# Patient Record
Sex: Male | Born: 2000 | Race: White | Hispanic: No | Marital: Single | Smoking: Never smoker
Health system: Southern US, Community
[De-identification: ages and names within clinical notes are randomized; demographics above are authoritative.]

## PROBLEM LIST (undated history)

## (undated) DIAGNOSIS — E785 Hyperlipidemia, unspecified: Secondary | ICD-10-CM

## (undated) HISTORY — DX: Hyperlipidemia, unspecified: E78.5

---
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
## 2018-04-06 DIAGNOSIS — R05 Cough: Secondary | ICD-10-CM | POA: Diagnosis not present

## 2018-04-06 DIAGNOSIS — R590 Localized enlarged lymph nodes: Secondary | ICD-10-CM | POA: Diagnosis not present

## 2018-04-13 ENCOUNTER — Encounter: Payer: Self-pay | Admitting: Family Medicine

## 2018-04-13 ENCOUNTER — Ambulatory Visit: Payer: BLUE CROSS/BLUE SHIELD | Admitting: Family Medicine

## 2018-04-13 VITALS — BP 112/76 | HR 76 | Temp 98.3°F | Ht 76.0 in | Wt 215.6 lb

## 2018-04-13 DIAGNOSIS — J209 Acute bronchitis, unspecified: Secondary | ICD-10-CM

## 2018-04-13 MED ORDER — BENZONATATE 100 MG PO CAPS: 200.0000 mg | ORAL_CAPSULE | Freq: Three times a day (TID) | ORAL | 0 refills | Status: AC | PRN

## 2018-04-13 MED ORDER — ALBUTEROL SULFATE HFA 108 (90 BASE) MCG/ACT IN AERS: 2.0000 | INHALATION_SPRAY | Freq: Four times a day (QID) | RESPIRATORY_TRACT | 2 refills | Status: AC | PRN

## 2018-04-13 MED ORDER — AMOXICILLIN-POT CLAVULANATE 875-125 MG PO TABS: 1.0000 | ORAL_TABLET | Freq: Two times a day (BID) | ORAL | 0 refills | Status: DC

## 2018-04-13 NOTE — Progress Notes (Signed)
   Subjective:    Patient ID: Eugene Harris, male    DOB: [birth year], 17 y.o.   MRN: [id number]  HPI He complains of a 4-week history that started with nasal congestion, sore throat, rhinorrhea, slight cough but no fever, chills or earache.  He does not smoke.  He did improve but approximately 1 week later the coughing got much worse.  He was also having some right inguinal lymph node swelling.  He was seen in student health.  STD testing was apparently negative.  They treated him symptomatically.  He continues have difficulty with coughing paroxysms but no fever, chills.   Review of Systems     Objective:   Physical Exam Alert and in no distress. Tympanic membranes and canals are normal. Pharyngeal area is normal. Neck is supple without adenopathy or thyromegaly. Cardiac exam shows a regular sinus rhythm without murmurs or gallops. Lungs are clear to auscultation. Abdominal exam shows no abdominal wall tenderness except with contraction of the lower abdominal muscles.  No lymphadenopathy was noted.       Assessment & Plan:  Acute bronchitis, unspecified organism - Plan: albuterol (PROVENTIL HFA;VENTOLIN HFA) 108 (90 Base) MCG/ACT inhaler, amoxicillin-clavulanate (AUGMENTIN) 875-125 MG tablet, benzonatate (TESSALON PERLES) 100 MG capsule I will treat the infection and give him Tessalon Perles as well as Proventil.  He is to take all the antibiotic and call me if he is not totally back to normal.  No major adenopathy and that is lower abdominal discomfort was probably from coughing.

## 2018-07-18 ENCOUNTER — Ambulatory Visit: Payer: BLUE CROSS/BLUE SHIELD | Admitting: Family Medicine

## 2018-07-21 ENCOUNTER — Encounter: Payer: Self-pay | Admitting: Family Medicine

## 2018-07-21 ENCOUNTER — Ambulatory Visit: Payer: BLUE CROSS/BLUE SHIELD | Admitting: Family Medicine

## 2018-07-21 ENCOUNTER — Ambulatory Visit
Admission: RE | Admit: 2018-07-21 | Discharge: 2018-07-21 | Disposition: A | Discharge status: Home / Self Care | Payer: BLUE CROSS/BLUE SHIELD | Source: Ambulatory Visit | Attending: Family Medicine | Admitting: Family Medicine

## 2018-07-21 DIAGNOSIS — R05 Cough: Secondary | ICD-10-CM | POA: Diagnosis not present

## 2018-07-21 DIAGNOSIS — J209 Acute bronchitis, unspecified: Secondary | ICD-10-CM | POA: Diagnosis not present

## 2018-07-21 MED ORDER — AMOXICILLIN-POT CLAVULANATE 875-125 MG PO TABS: 1.0000 | ORAL_TABLET | Freq: Two times a day (BID) | ORAL | 0 refills | Status: AC

## 2018-07-21 NOTE — Progress Notes (Signed)
   Subjective:    Patient ID: Eugene Harris, male    DOB: 22-Jun-2001, 18 y.o.   MRN: [id number]  HPI He was seen in October and treated with Augmentin.  He states that it helped with the cough that went away several days after he finished the antibiotic.  He did use the inhaler.  Since then he has had continued difficulty with intermittent cough that is intermittently productive as well as wheezing mainly on the left-hand side.  No fever, chills, sore throat or earache.  Does not smoke and has no history of allergies.   Review of Systems     Objective:   Physical Exam Alert and in no distress. Tympanic membranes and canals are normal. Pharyngeal area is normal. Neck is supple without adenopathy or thyromegaly. Cardiac exam shows a regular sinus rhythm without murmurs or gallops. Lungs are clear to auscultation.        Assessment & Plan:  Acute bronchitis, unspecified organism - Plan: DG Chest 2 View, amoxicillin-clavulanate (AUGMENTIN) 875-125 MG tablet Since he is having continued difficulty and wheezing mainly on the left, I think an x-ray is certainly appropriate.

## 2018-11-15 DIAGNOSIS — M9904 Segmental and somatic dysfunction of sacral region: Secondary | ICD-10-CM | POA: Diagnosis not present

## 2018-11-15 DIAGNOSIS — M9903 Segmental and somatic dysfunction of lumbar region: Secondary | ICD-10-CM | POA: Diagnosis not present

## 2018-11-15 DIAGNOSIS — M5432 Sciatica, left side: Secondary | ICD-10-CM | POA: Diagnosis not present

## 2018-11-15 DIAGNOSIS — M9902 Segmental and somatic dysfunction of thoracic region: Secondary | ICD-10-CM | POA: Diagnosis not present

## 2018-11-16 DIAGNOSIS — M5432 Sciatica, left side: Secondary | ICD-10-CM | POA: Diagnosis not present

## 2018-11-16 DIAGNOSIS — M9903 Segmental and somatic dysfunction of lumbar region: Secondary | ICD-10-CM | POA: Diagnosis not present

## 2018-11-16 DIAGNOSIS — M461 Sacroiliitis, not elsewhere classified: Secondary | ICD-10-CM | POA: Diagnosis not present

## 2018-11-16 DIAGNOSIS — M9902 Segmental and somatic dysfunction of thoracic region: Secondary | ICD-10-CM | POA: Diagnosis not present

## 2018-11-17 DIAGNOSIS — M461 Sacroiliitis, not elsewhere classified: Secondary | ICD-10-CM | POA: Diagnosis not present

## 2018-11-17 DIAGNOSIS — M9903 Segmental and somatic dysfunction of lumbar region: Secondary | ICD-10-CM | POA: Diagnosis not present

## 2018-11-17 DIAGNOSIS — M9902 Segmental and somatic dysfunction of thoracic region: Secondary | ICD-10-CM | POA: Diagnosis not present

## 2018-11-17 DIAGNOSIS — M5432 Sciatica, left side: Secondary | ICD-10-CM | POA: Diagnosis not present

## 2018-11-24 DIAGNOSIS — M9903 Segmental and somatic dysfunction of lumbar region: Secondary | ICD-10-CM | POA: Diagnosis not present

## 2018-11-24 DIAGNOSIS — M9902 Segmental and somatic dysfunction of thoracic region: Secondary | ICD-10-CM | POA: Diagnosis not present

## 2018-11-24 DIAGNOSIS — M5432 Sciatica, left side: Secondary | ICD-10-CM | POA: Diagnosis not present

## 2018-11-24 DIAGNOSIS — M461 Sacroiliitis, not elsewhere classified: Secondary | ICD-10-CM | POA: Diagnosis not present

## 2018-12-01 DIAGNOSIS — M9902 Segmental and somatic dysfunction of thoracic region: Secondary | ICD-10-CM | POA: Diagnosis not present

## 2018-12-01 DIAGNOSIS — M5432 Sciatica, left side: Secondary | ICD-10-CM | POA: Diagnosis not present

## 2018-12-01 DIAGNOSIS — M461 Sacroiliitis, not elsewhere classified: Secondary | ICD-10-CM | POA: Diagnosis not present

## 2018-12-01 DIAGNOSIS — M9903 Segmental and somatic dysfunction of lumbar region: Secondary | ICD-10-CM | POA: Diagnosis not present

## 2018-12-21 DIAGNOSIS — M9902 Segmental and somatic dysfunction of thoracic region: Secondary | ICD-10-CM | POA: Diagnosis not present

## 2018-12-21 DIAGNOSIS — M461 Sacroiliitis, not elsewhere classified: Secondary | ICD-10-CM | POA: Diagnosis not present

## 2018-12-21 DIAGNOSIS — M9903 Segmental and somatic dysfunction of lumbar region: Secondary | ICD-10-CM | POA: Diagnosis not present

## 2019-06-06 IMAGING — CR DG CHEST 2V
2 series · 2 of 2 positions shown · non-contrast
Comparison: None.

CLINICAL DATA: Cough, wheezing, bronchitis

EXAM:
CHEST - 2 VIEW

[w chest pa]
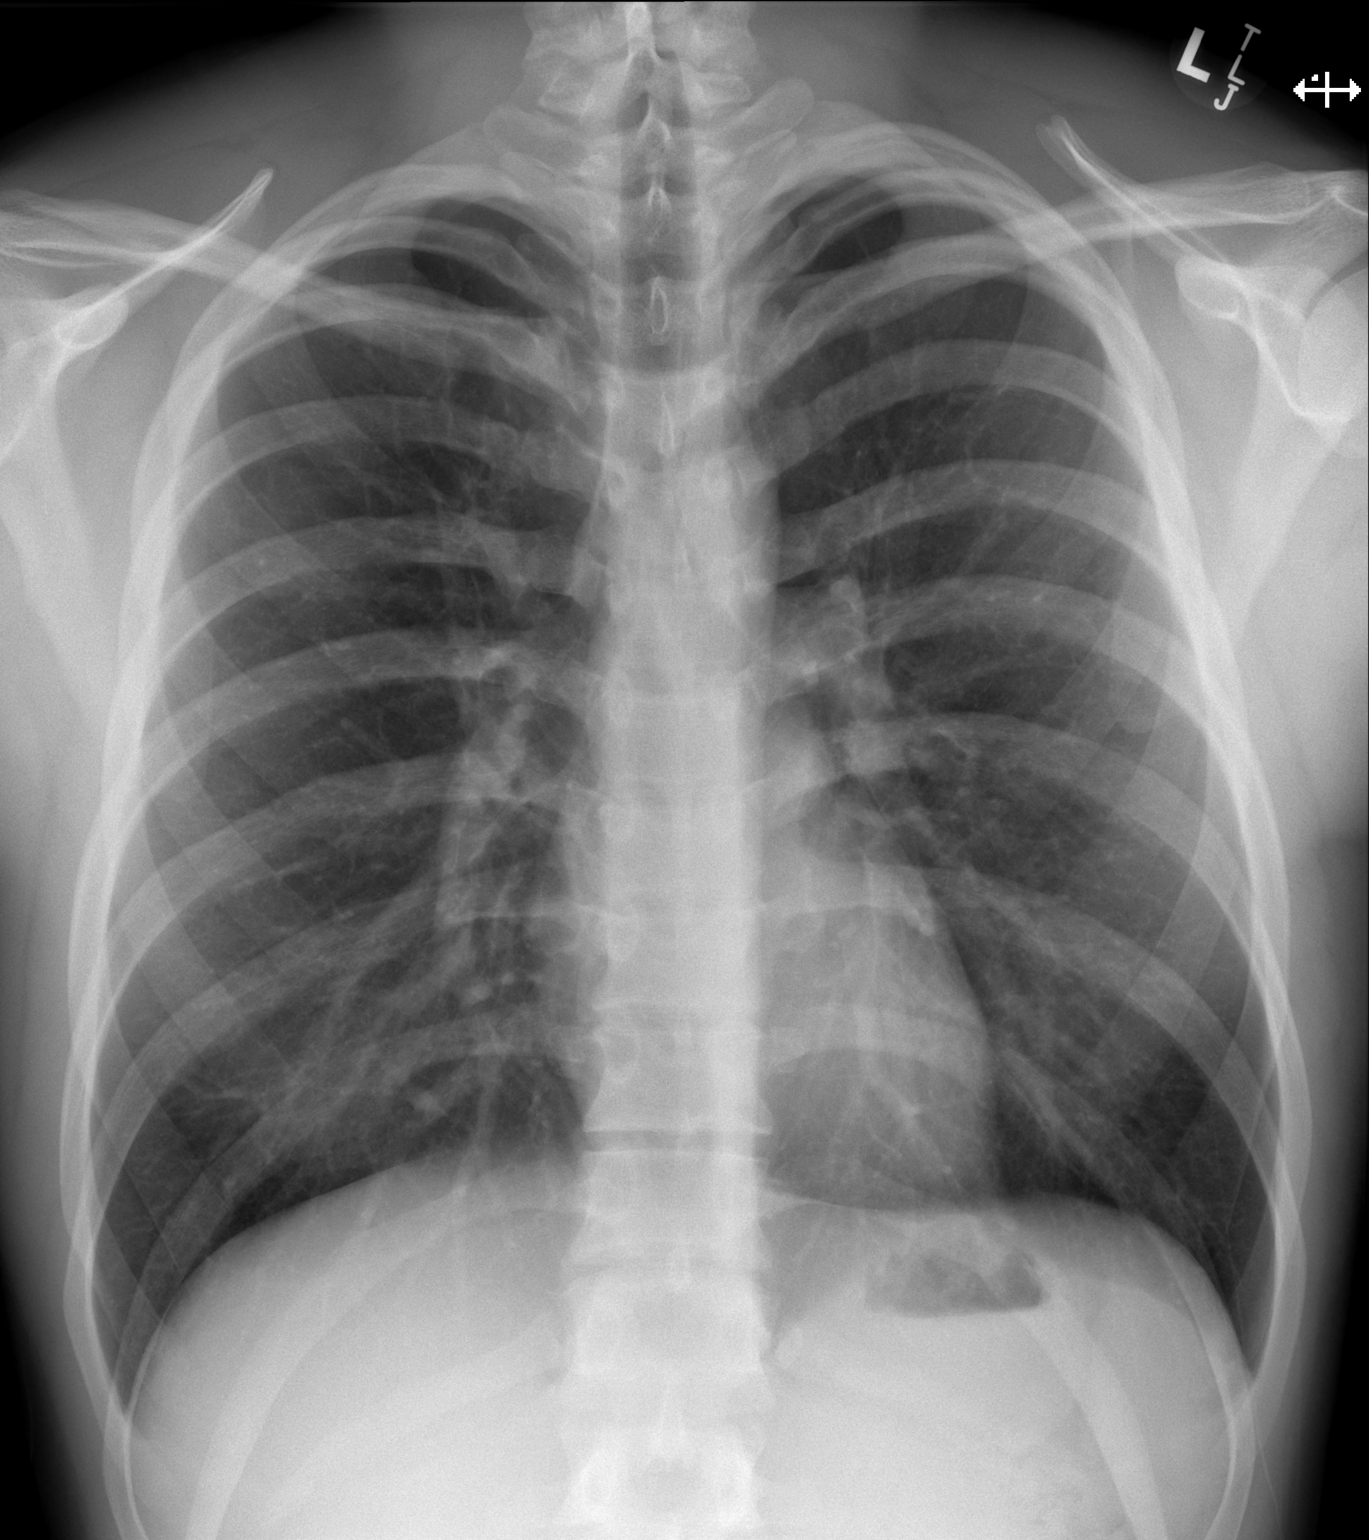

[w chest lat]
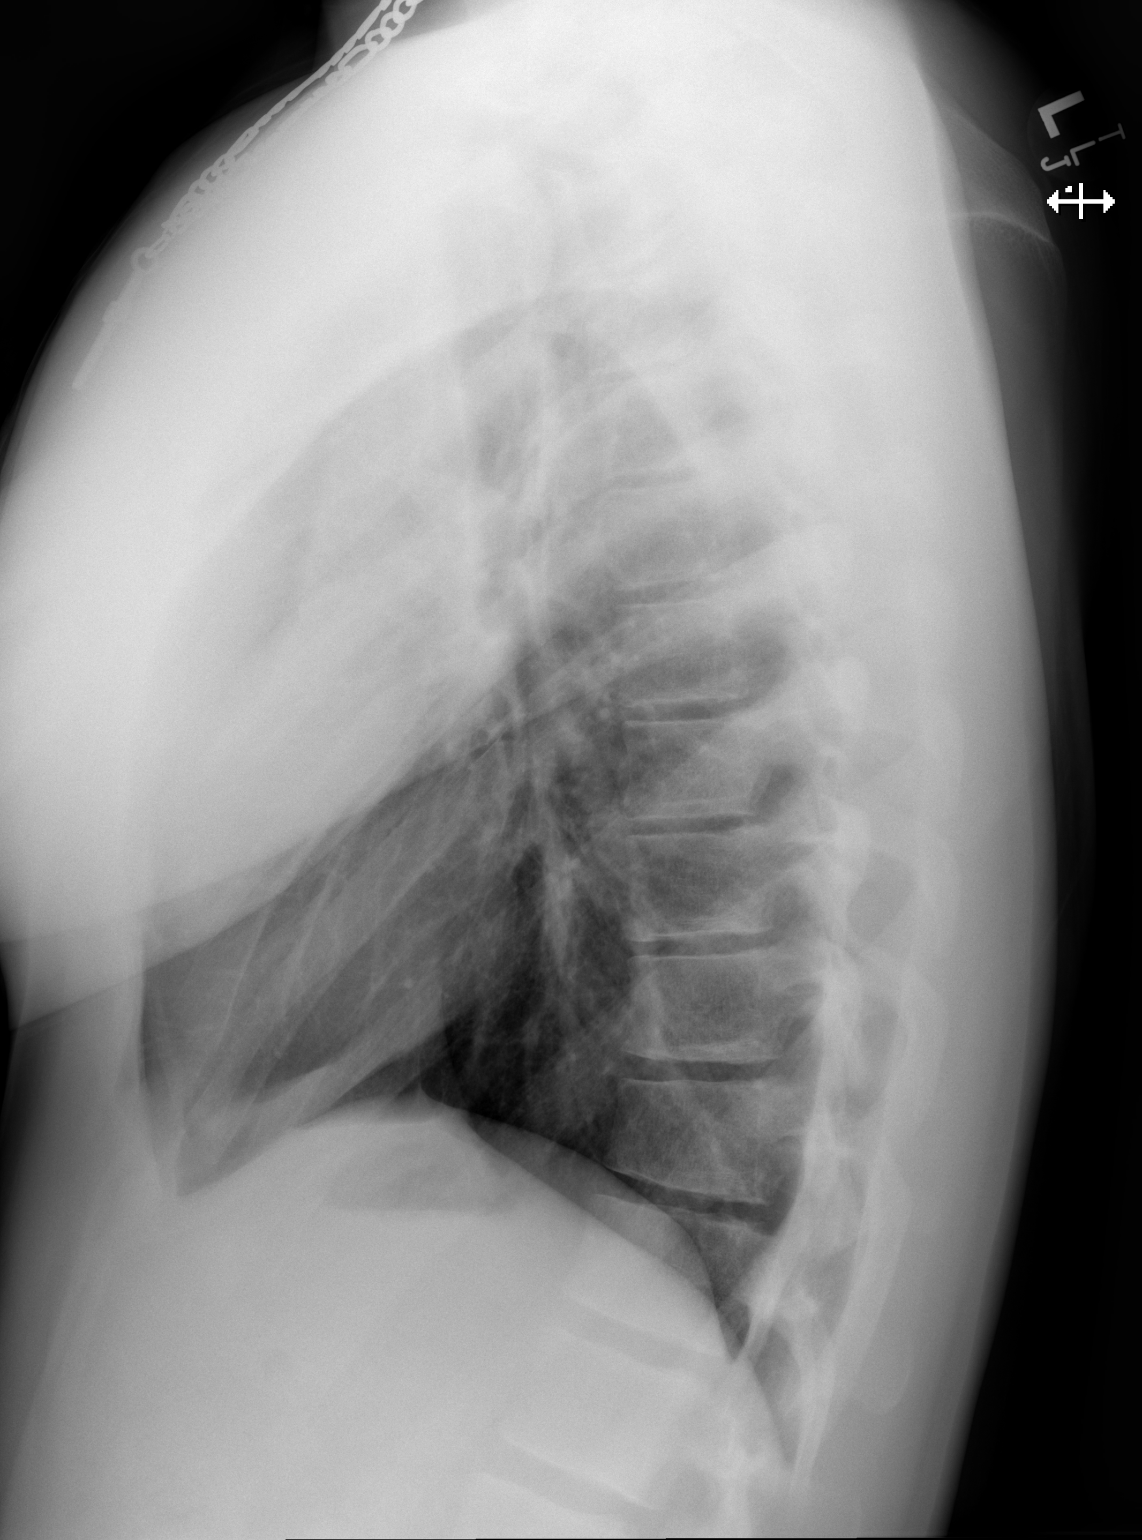

[2 of 2 positions shown; findings below may reference images not displayed]

FINDINGS: The heart size and mediastinal contours are within normal limits.
Both lungs are clear. The visualized skeletal structures are
unremarkable.
IMPRESSION: No active cardiopulmonary disease.

## 2021-08-25 NOTE — Progress Notes (Unsigned)
[address]  [place], [place] [address]  Ph. [phone], Valinda Hoar [phone]                Date of Exam: 08/26/2021 12:59 PM        Patient ID: Kevin Flowers is a 21 y.o. male.  Attending Physician: Laurence Spates, DNP FNP        Chief Complaint:    No chief complaint on file.              HPI:    Pt is 21 yo male presenting for well exam. Patient is fasting today***    Chronic diseases***  Refills needed today: ***  Pharmacy: ***    Depression screen:  PHQ-2/9 score: ***  No concerns for SI or HI: ***    Vaccines:  - TDAP: ***  - Flu: ***  - COVID: 4/4***    Screenings:  Last eye exam: ***  Last dental exam: every 6 months***   Derm: ***    Exercise: ***  Diet:  ***  H2O: adequate***  Caffiene: ***   Smoking:  ***  Alcohol: ***  Sleep: 7-8 hrs***  Sexually active: ***  Any concerns for STI: ***  Problems with erections: ***  Self testicular exams: ***  Advanced Directives: ***    New problems:    Patient verbally understands that any new acute health problems, uncontrolled chronic medical conditions, or office procedures that is done today are not considered part of the physical and that an additional copayment or deductible may apply***          Problem List:    There is no problem list on file for this patient.            Current Meds:    No current outpatient medications on file.        Allergies:    Not on File          Past Surgical History:    No past surgical history on file.        Family History:    No family history on file.        Social History:             The following sections were reviewed this encounter by the provider:            Vital Signs:    There were no vitals taken for this visit.         ROS:    Review of Systems   Constitutional:  Negative for chills, fatigue, fever and unexpected weight change.   HENT:  Negative for ear pain and hearing loss.    Eyes:  Negative for pain and visual disturbance.   Respiratory:  Negative for shortness of breath.     Cardiovascular:  Negative for chest pain, palpitations and leg swelling.   Gastrointestinal:  Negative for abdominal pain, blood in stool, constipation, diarrhea, nausea and vomiting.   Endocrine: Negative for cold intolerance and heat intolerance.   Genitourinary:  Negative for difficulty urinating.   Musculoskeletal:  Negative for arthralgias.   Skin:  Negative for rash.   Neurological:  Negative for tremors, weakness, numbness and headaches.   Hematological:  Does not bruise/bleed easily.   Psychiatric/Behavioral:  Negative for dysphoric mood.             Physical Exam:    Physical Exam  Vitals reviewed.   Constitutional:       General: He  is not in acute distress.     Appearance: Normal appearance. He is well-developed and well-groomed. He is not ill-appearing or toxic-appearing.   HENT:      Head: Normocephalic and atraumatic.      Right Ear: Tympanic membrane, ear canal and external ear normal.      Left Ear: Tympanic membrane, ear canal and external ear normal.      Nose: Nose normal.      Mouth/Throat:      Mouth: Mucous membranes are moist.      Pharynx: Oropharynx is clear. Uvula midline. No posterior oropharyngeal erythema.   Eyes:      General: Lids are normal. No scleral icterus.     Extraocular Movements: Extraocular movements intact.      Pupils: Pupils are equal, round, and reactive to light.   Neck:      Thyroid: No thyroid mass, thyromegaly or thyroid tenderness.      Vascular: No carotid bruit.   Cardiovascular:      Rate and Rhythm: Normal rate and regular rhythm.      Pulses:           Radial pulses are 2+ on the right side and 2+ on the left side.        Dorsalis pedis pulses are 2+ on the right side and 2+ on the left side.      Heart sounds: Normal heart sounds.   Pulmonary:      Effort: Pulmonary effort is normal. No respiratory distress.      Breath sounds: Normal breath sounds and air entry. No stridor or decreased air movement. No decreased breath sounds, wheezing, rhonchi or rales.    Abdominal:      General: Abdomen is flat. Bowel sounds are normal. There is no distension.      Palpations: Abdomen is soft. There is no hepatomegaly, splenomegaly or mass.      Tenderness: There is no abdominal tenderness.   Genitourinary:     Comments: Discussion today with pt  No issues or concerns  Pt and I have decided to defer exam  Will followup if any issues at anytime    Musculoskeletal:         General: No swelling or tenderness.      Cervical back: Neck supple.      Right lower leg: No edema.      Left lower leg: No edema.   Lymphadenopathy:      Cervical: No cervical adenopathy.   Skin:     General: Skin is warm and dry.      Coloration: Skin is not cyanotic or jaundiced.      Findings: No lesion.      Nails: There is no clubbing.   Neurological:      General: No focal deficit present.      Mental Status: He is alert. Mental status is at baseline.      Cranial Nerves: No cranial nerve deficit or facial asymmetry.      Sensory: No sensory deficit.      Motor: No weakness.      Gait: Gait normal.   Psychiatric:         Attention and Perception: Attention normal.         Mood and Affect: Affect normal.         Behavior: Behavior normal. Behavior is cooperative.              Assessment:    1. Well  adult exam    2. Screening for lipid disorders            Plan:    General Wellness:  Check screening blood work - informed pt results will be available via MyChart  Encouraged regular aerobic exercise at least 3-4 times per week for 30-45 minutes per session  Encouraged healthy diet with 5-9 servings of fruits and vegetables per day  Encouraged dental exam every 6-12 months  Encouraged eye exam every 1-2 years  Encouraged adequate daily H2O intake  Education provided regarding sun exposure and skin care habits  Continue specialist visits for your chronic diseases***  Please sign up to My Chart. This system allows you to review your medical records, lab results and it also allows you to e-mail me directly and or  make appointments with me on line.***    Vaccines:   *** administered today - VIS provided to pt   Advised patient to get the COVID booster and flu vaccine*** at a local pharmacy***  Pt educated regarding common side effects and warning signs of the vaccines administered today    All questions answered. Patient agreeable and amenable to plan. Patient verbalizes understanding.          Follow-up:    No follow-ups on file.         Laurence Spates, DNP FNP

## 2021-08-26 ENCOUNTER — Ambulatory Visit (INDEPENDENT_AMBULATORY_CARE_PROVIDER_SITE_OTHER): Payer: No Typology Code available for payment source | Admitting: Registered Nurse

## 2021-08-26 ENCOUNTER — Encounter (INDEPENDENT_AMBULATORY_CARE_PROVIDER_SITE_OTHER): Payer: Self-pay | Admitting: Registered Nurse

## 2021-08-26 VITALS — BP 110/70 | HR 72 | Temp 97.9°F | Resp 16 | Ht 75.75 in | Wt 221.6 lb

## 2021-08-26 DIAGNOSIS — M25561 Pain in right knee: Secondary | ICD-10-CM

## 2021-08-26 DIAGNOSIS — Z8279 Family history of other congenital malformations, deformations and chromosomal abnormalities: Secondary | ICD-10-CM

## 2021-08-26 DIAGNOSIS — Z0001 Encounter for general adult medical examination with abnormal findings: Secondary | ICD-10-CM

## 2021-08-26 DIAGNOSIS — G8929 Other chronic pain: Secondary | ICD-10-CM

## 2021-08-26 DIAGNOSIS — Z0000 Encounter for general adult medical examination without abnormal findings: Secondary | ICD-10-CM

## 2021-08-26 DIAGNOSIS — Z13220 Encounter for screening for lipoid disorders: Secondary | ICD-10-CM

## 2021-08-26 LAB — COMPREHENSIVE METABOLIC PANEL
ALT: 18 U/L (ref 0–55)
AST (SGOT): 18 U/L (ref 5–41)
Albumin/Globulin Ratio: 1.7 (ref 0.9–2.2)
Albumin: 4.4 g/dL (ref 3.5–5.0)
Alkaline Phosphatase: 69 U/L (ref 37–117)
Anion Gap: 9 (ref 5.0–15.0)
BUN: 14 mg/dL (ref 9.0–28.0)
Bilirubin, Total: 0.6 mg/dL (ref 0.2–1.2)
CO2: 27 mEq/L (ref 17–29)
Calcium: 9.7 mg/dL (ref 8.5–10.5)
Chloride: 104 mEq/L (ref 99–111)
Creatinine: 1 mg/dL (ref 0.5–1.5)
Globulin: 2.6 g/dL (ref 2.0–3.6)
Glucose: 87 mg/dL (ref 70–100)
Potassium: 4.3 mEq/L (ref 3.5–5.3)
Protein, Total: 7 g/dL (ref 6.0–8.3)
Sodium: 140 mEq/L (ref 135–145)

## 2021-08-26 LAB — GFR: EGFR: >60

## 2021-08-26 LAB — LIPID PANEL
Cholesterol / HDL Ratio: 3.5 Index
Cholesterol: 203 mg/dL — ABNORMAL HIGH (ref 0–199)
HDL: 58 mg/dL (ref 40–9999)
LDL Calculated: 134 mg/dL — ABNORMAL HIGH (ref 0–99)
Triglycerides: 57 mg/dL (ref 34–149)
VLDL Calculated: 11 mg/dL (ref 10–40)

## 2021-08-26 LAB — HEMOLYSIS INDEX: Hemolysis Index: 6 Index (ref 0–24)

## 2021-08-26 NOTE — Patient Instructions (Addendum)
Plan:    General Wellness:  Check screening blood work - informed pt results will be available via MyChart  Encouraged regular aerobic exercise at least 3-4 times per week for 30-45 minutes per session  Encouraged healthy diet with 5-9 servings of fruits and vegetables per day  Encouraged dental exam every 6-12 months  Encouraged eye exam every 1-2 years  Encouraged adequate daily H2O intake  Education provided regarding sun exposure and skin care habits    Chronic right knee pain  Advised patient to follow-up with the orthopedist-referral placed and recommendations provided    Family history of bicuspid aortic valve:  Follow-up with the cardiologist-referral placed and recommendations provided    Vaccines:   Patient will check his Tdap and HPV vaccine records and upload. advised patient to schedule nurse visit appointment if he has not gotten the tetanus vaccine in the last 10 years and if he has not had the HPV vaccine  Advised patient to get the COVID booster and flu vaccine at a local pharmacy          Follow-up:    Return in about 1 year (around 08/27/2022) for  annual exam or sooner as needed.    Cardiologist in the area    [facility]  [address]  [place], [place] [address]  Tel: [phone]    [facility]   [phone]  [email or web address]    [facility]   [phone]  [email or web address]      Orthopedic doctors in the area    [facility] offices  [address]  [address]  [place], [place] [address]  [phone]  [email or web address]    [facility]  [address]  [place], [place] [address]  [phone]  [email or web address]    [facility]  [address] 8th floor  [place], [place] [address]  [phone]  [email or web address]    [facility]  [address] [place], [place] [address]  [email or web address]  [phone]    [employer] [facility])  [address]  [place], [place] [address]   "[facility]"  [email or web address]  [phone]    [facility]  [address]  [place], [place] [address]    [facility]  [phone]  [place], [place] [address]  [phone]  [email or web address]    [facility]  [email or web address]  - Offices in [place] and [employer]     OR     [facility]  [email or web address]  - multiple offices    OR    [facility], [facility], [facility]  [email or web address]  - [phone]

## 2021-09-23 ENCOUNTER — Ambulatory Visit (INDEPENDENT_AMBULATORY_CARE_PROVIDER_SITE_OTHER): Payer: No Typology Code available for payment source | Admitting: Cardiology

## 2021-10-01 ENCOUNTER — Ambulatory Visit (INDEPENDENT_AMBULATORY_CARE_PROVIDER_SITE_OTHER): Payer: No Typology Code available for payment source | Admitting: Cardiology

## 2021-10-01 ENCOUNTER — Encounter (INDEPENDENT_AMBULATORY_CARE_PROVIDER_SITE_OTHER): Payer: Self-pay | Admitting: Cardiology

## 2021-10-01 VITALS — BP 112/70 | HR 55 | Ht 76.0 in | Wt 217.0 lb

## 2021-10-01 DIAGNOSIS — Z8279 Family history of other congenital malformations, deformations and chromosomal abnormalities: Secondary | ICD-10-CM

## 2021-10-01 DIAGNOSIS — R079 Chest pain, unspecified: Secondary | ICD-10-CM

## 2021-10-01 NOTE — Progress Notes (Signed)
[facility] OFFICE CONSULTATION NOTE    HRT [facility] OFFICE -CARDIOLOGY  [address]  [place] [address]  Dept: [phone]  Dept Fax: [phone]         Patient Name: Kevin Flowers, Kevin Flowers    Date of Visit:  October 01, 2021  Date of Birth: 2000-12-11  AGE: 21 y.o.  Medical Record #: [id number]  Requesting Physician: Laurence Spates, DNP FNP      CHIEF COMPLAINT:  Establish Care      HISTORY OF PRESENT ILLNESS    Kevin Flowers is being seen today for cardiovascular evaluation at the request of Laurence Spates, DNP FNP. He is a pleasant 21 y.o. male who presents for chest discomfort and family history of bicuspid aortic valve.    Patient reports he has been feeling symptomatically well.  His uncle was diagnosed with a bicuspid aortic valve, and recently required surgery.  He is unaware of other family members with bicuspid valve.  His mother has been tested and the valve was normal.  He is uncertain about his father.    Patient also notes that since his teenage years, he has had some left-sided chest discomfort.  He described a stabbing sensation in the left chest, typically worsened with changes in position and taking a deep breath.  He exercises vigorously, primarily weightlifting.  There is no exertional component to his chest discomfort.    With respect ASCVD risk factors, patient has normal blood pressure.  Lipids are mildly elevated.  He does not have diabetes.  He does not smoke cigarettes.      PAST MEDICAL HISTORY: He has a past medical history of Hyperlipidemia (5 years ago). He has no past surgical history on file.    ALLERGIES: No Known Allergies    MEDICATIONS:   Patient's current medications were reviewed. ONLY Cardiac medications were updated unless others were addressed in assessment and plan.  No current outpatient medications on file.     FAMILY HISTORY: family history is not on file.    SOCIAL HISTORY: He reports that he has never  smoked. He quit smokeless tobacco use about 10 months ago.  His smokeless tobacco use included chew. He reports current alcohol use of about 6.0 standard drinks of alcohol per week. He reports that he does not use drugs.    REVIEW OF SYSTEMS:   No data recorded       PHYSICAL EXAMINATION    Visit Vitals  BP 112/70 (BP Site: Left arm, Patient Position: Sitting, Cuff Size: Medium)   Pulse (!) 55   Ht 1.93 m (6\' 4" )   Wt 98.4 kg (217 lb)   BMI 26.41 kg/m        General Appearance:  A well-appearing male in no acute distress.    Skin: Warm and dry to touch, no apparent skin lesions, or masses noted.  Head: Normocephalic, normal hair pattern, no masses or tenderness   Eyes: EOMS Intact, conjunctivae and lids unremarkable.  ENT: Ears, Nose and throat reveal no gross abnormalities.  No pallor or cyanosis.  Dentition good.   Neck: JVP normal, no carotid bruit  Chest: Clear to auscultation bilaterally with good air movement and respiratory effort and no wheezes, rales, or rhonchi   Cardiovascular: Regular rhythm, S1 normal, S2 normal, No S3 or S4.  No murmur. No gallops or rubs detected   Abdomen: Soft, nontender, nondistended.  Extremities: Warm without edema. No clubbing, or cyanosis. All  peripheral pulses are full and equal.   Neuro: Alert and oriented x3. No gross motor or sensory deficits noted, affect appropriate.        ECG: Sinus bradycardia.  No ST segment or T wave abnormalities.      LABS:   No results found for: WBC, HGB, HCT, PLT  Lab Results   Component Value Date    GLU 87 08/26/2021    BUN 14.0 08/26/2021    CREAT 1.0 08/26/2021    NA 140 08/26/2021    K 4.3 08/26/2021    CL 104 08/26/2021    CO2 27 08/26/2021    AST 18 08/26/2021    ALT 18 08/26/2021     No results found for: MG, TSH, HGBA1C, BNP  Lab Results   Component Value Date    CHOL 203 (H) 08/26/2021    TRIG 57 08/26/2021    HDL 58 08/26/2021    LDL 134 (H) 08/26/2021           IMPRESSION:   Kevin Flowers is a 21 y.o. male with the following  problems:    Atypical chest discomfort.  Stabbing character, not provoked by exertion nor relieved by rest, in young patient with minimal ASCVD risk factors (mild HLD).  Hyperlipidemia.  LDL 134 mg/dL March 16102023.  Family history bicuspid AV in uncle.      RECOMMENDATIONS:    Reassured that suspicion is low for cardiac etiology of his chronic chest discomfort.  We will pursue treadmill stress test for reassurance.  Discussed maintenance of healthy weight, healthy diet choices and exercise as behavioral management of mild hyperlipidemia.  Echocardiogram to assess aortic valve given family history.                                                 Orders Placed This Encounter   Procedures    ECG 12 lead (Normal)    Exercise Stress Test (Office Treadmill)    Echocardiogram Adult Complete W Clr/ Dopp Waveform       No orders of the defined types were placed in this encounter.        SIGNED:    Verner MouldScott M Margretta Zamorano, MD         This note was generated by the Dragon speech recognition and may contain errors or omissions not intended by the user. Grammatical errors, random word insertions, deletions, pronoun errors, and incomplete sentences are occasional consequences of this technology due to software limitations. Not all errors are caught or corrected. If there are questions or concerns about the content of this note or information contained within the body of this dictation, they should be addressed directly with the author for clarification.

## 2021-10-06 ENCOUNTER — Encounter (INDEPENDENT_AMBULATORY_CARE_PROVIDER_SITE_OTHER): Payer: Self-pay

## 2021-11-11 ENCOUNTER — Other Ambulatory Visit (INDEPENDENT_AMBULATORY_CARE_PROVIDER_SITE_OTHER): Payer: No Typology Code available for payment source

## 2021-11-25 ENCOUNTER — Ambulatory Visit (INDEPENDENT_AMBULATORY_CARE_PROVIDER_SITE_OTHER): Payer: No Typology Code available for payment source | Admitting: Internal Medicine

## 2021-11-25 ENCOUNTER — Encounter (INDEPENDENT_AMBULATORY_CARE_PROVIDER_SITE_OTHER): Payer: Self-pay

## 2021-11-25 VITALS — BP 122/76 | HR 56 | Temp 97.5°F | Resp 16 | Ht 76.0 in | Wt 220.0 lb

## 2021-11-25 DIAGNOSIS — B349 Viral infection, unspecified: Secondary | ICD-10-CM

## 2021-11-25 DIAGNOSIS — J029 Acute pharyngitis, unspecified: Secondary | ICD-10-CM

## 2021-11-25 LAB — POCT RAPID STREP A: Rapid Strep A Screen POCT: NEGATIVE

## 2021-11-25 LAB — POCT INFECTIOUS MONONUCLEOSIS ANTIBODY: POCT Infectious Mono Heterophile Antibodies: NEGATIVE

## 2021-11-25 LAB — IN OFFICE COVID POCT ABBOTT ID NOW: SARS CoV 2 Overall Result: NEGATIVE

## 2021-11-25 NOTE — Progress Notes (Signed)
[facility]  URGENT  CARE  PROGRESS NOTE     Patient: Kevin Flowers   Date: 11/25/2021   MRN: [id number]       Kevin Flowers is a 21 y.o. male      HISTORY     History obtained from: Patient    Chief Complaint   Patient presents with    Sore Throat     Sore throat, chills and fatigue since Monday        HPI 21 yo M with no significant PMH.  C/O acute onset on 5-29 of pharyngitis, chills and very fatigued.  No N/V/D, SOB, CP or abdominal pain.  COVID vaxxed.  No meds taken.    Review of Systems   All other systems reviewed and are negative.  As above.    History:    Pertinent Past Medical, Surgical, Family and Social History were reviewed.      No current outpatient medications on file.    No Known Allergies    Medications and Allergies reviewed.    PHYSICAL EXAM     Vitals:    11/25/21 1047   BP: 122/76   Pulse: (!) 56   Resp: 16   Temp: 97.5 F (36.4 C)   TempSrc: Oral   SpO2: 98%   Weight: 99.8 kg (220 lb)   Height: 1.93 m (6\' 4" )       Physical Exam  HENT:      Mouth/Throat:      Mouth: Mucous membranes are moist.      Pharynx: Oropharynx is clear. Posterior oropharyngeal erythema present. No oropharyngeal exudate.       Vitals and nursing note reviewed.   Constitutional:       General: Not in acute distress.     Appearance: Normal appearance. Not ill-appearing or toxic-appearing.   HENT:      Head: Normocephalic and atraumatic.   Eyes:      Conjunctiva/sclera: Conjunctivae normal.   Neck:      Musculoskeletal: Normal range of motion.   Respiratory:      Normal effort. Able to speak in full sentences.  Neurological:      Mental Status: Alert and oriented.  Psychiatric:         Mood and Affect: Mood normal.         Behavior: Behavior normal.     UCC COURSE     LABS  The following POCT tests were ordered, reviewed and discussed with the patient/family.     Results       Procedure Component Value Units Date/Time    Mono Screen [[id number]]  (Normal) Collected: 11/25/21 1127     Updated: 11/25/21 1127     POCT QC Pass      POCT Infectious Mono Heterophile Antibodies Negative    [facility] ID Now SARS-COV-2 POCT [[id number]]  (Normal) Collected: 11/25/21 1106    Specimen: Nasal Swab COVID-19 Updated: 11/25/21 1106     SARS CoV 2 Overall Result Negative    POCT Rapid Group A Strep [[id number]]  (Normal) Collected: 11/25/21 1100    Specimen: Throat Updated: 11/25/21 1101     POCT QC Pass     Rapid Strep A Screen POCT Negative     Comment Negative Results should be confirmed by throat Cx to confirm absence of Strep A inf.            There were no x-rays reviewed with this patient during the visit.    No current facility-administered medications  for this visit.       PROCEDURES     Procedures    MEDICAL DECISION MAKING     History, physical, labs/studies most consistent with acute viral syndrome as the diagnosis.    Chart Review:  Prior PCP, Specialist and/or ED notes reviewed today: No  Prior labs/images/studies reviewed today: No    Differential Diagnosis: COVID 19; Flu; Acute viral syndrome; Strep; acute bronchitis - viral, bacterial, allergic       ASSESSMENT     Encounter Diagnoses   Name Primary?    Sore throat Yes    Acute viral syndrome             PLAN      PLAN: Tylenol / ibuprofen; Push fluids; Nasal saline;  F/U with PCP in 2-3 days.    Declined Throat culture.          Orders Placed This Encounter   Procedures    [facility]ott ID Now SARS-COV-2 POCT    POCT Rapid Group A Strep    Mono Screen     Requested Prescriptions      No prescriptions requested or ordered in this encounter       Discussed results and diagnosis with patient/family.  Reviewed warning signs for worsening condition, as well as, indications for follow-up with primary care physician and return to urgent care clinic.   Patient/family expressed understanding of instructions.     An After Visit Summary was provided to the patient.

## 2021-11-25 NOTE — Patient Instructions (Addendum)
Tylenol and or ibuprofen as directed on the bottles.  Nasal saline several times a day.  Nasal steroid.  Mucinex DM.  Push fluids with electrolytes.  To the Emergency Room or call 911 for any chest pain, difficulty breathing, heart racing, any concern.  Follow up with your doctor in 2-3 days.

## 2021-12-11 ENCOUNTER — Ambulatory Visit (INDEPENDENT_AMBULATORY_CARE_PROVIDER_SITE_OTHER): Payer: No Typology Code available for payment source | Admitting: Family

## 2021-12-11 ENCOUNTER — Encounter (INDEPENDENT_AMBULATORY_CARE_PROVIDER_SITE_OTHER): Payer: Self-pay

## 2021-12-11 VITALS — BP 150/82 | HR 80 | Temp 97.3°F | Resp 18 | Ht 76.0 in | Wt 214.0 lb

## 2021-12-11 DIAGNOSIS — N50812 Left testicular pain: Secondary | ICD-10-CM

## 2021-12-11 LAB — MCKESSON POCT UA 120
Bilirubin UA: NEGATIVE
Blood UA: NEGATIVE
Glucose UA: NEGATIVE
Ketone UA: NEGATIVE
Leukocytes UA: NEGATIVE
Nitrite UA: NEGATIVE
Protein UA: NEGATIVE
Specific Gravity UA: 1.015
Urobilinogen UA: NEGATIVE
pH UA: 6

## 2021-12-11 MED ORDER — DOXYCYCLINE HYCLATE 100 MG PO TABS
100.0000 mg | ORAL_TABLET | Freq: Two times a day (BID) | ORAL | 0 refills | Status: AC
Start: 2021-12-11 — End: 2021-12-21

## 2021-12-11 NOTE — Progress Notes (Signed)
[facility]  [facility]  PROGRESS NOTE     Patient: Kevin Flowers   Date: 12/11/2021   MRN: [id number]       Kevin Flowers is a 21 y.o. male present with complain of left groin/testicular pain for the past 1.5 months.  States symptoms started after masturbating and pain is always after masturbating as well.  Denies dysuria, or penile discharge or fever.  Denies recent trauma or strenuous activities       HISTORY     History obtained from: Patient    Chief Complaint   Patient presents with    Pain     Sx x 1-2 months  Groin  denies any recent injury or trauma          History provided by:  Patient  Language interpreter used: No         Review of Systems   Constitutional: Negative.    HENT: Negative.     Respiratory: Negative.     Cardiovascular: Negative.    Genitourinary:  Positive for testicular pain. Negative for difficulty urinating, dysuria, flank pain, frequency, genital sores, penile discharge, penile pain, penile swelling and scrotal swelling.   Musculoskeletal: Negative.    Neurological: Negative.    Psychiatric/Behavioral: Negative.         History:    Pertinent Past Medical, Surgical, Family and Social History were reviewed.        Current Outpatient Medications:     doxycycline (VIBRA-TABS) 100 MG tablet, Take 1 tablet (100 mg) by mouth 2 (two) times daily for 10 days, Disp: 20 tablet, Rfl: 0    No Known Allergies    Medications and Allergies reviewed.    PHYSICAL EXAM     Vitals:    12/11/21 1739   BP: 150/82   Pulse: 80   Resp: 18   Temp: 97.3 F (36.3 C)   TempSrc: Tympanic   SpO2: 99%   Weight: 97.1 kg (214 lb)   Height: 1.93 m (6\' 4" )       Physical Exam  Constitutional:       General: He is not in acute distress.     Appearance: Normal appearance. He is normal weight. He is not ill-appearing, toxic-appearing or diaphoretic.   Cardiovascular:      Rate and Rhythm: Normal rate.   Pulmonary:      Effort: Pulmonary effort is normal.   Genitourinary:     Testes: Normal.         Right: Tenderness or  swelling not present.         Left: Tenderness or swelling not present.      Epididymis:      Right: Not inflamed. No tenderness.      Left: Not inflamed. No tenderness.   Neurological:      General: No focal deficit present.      Mental Status: He is alert and oriented to person, place, and time.   Skin:     General: Skin is warm.   Psychiatric:         Mood and Affect: Mood normal.         Behavior: Behavior normal.   Vitals and nursing note reviewed.         UCC COURSE     LABS  The following POCT tests were ordered, reviewed and discussed with the patient/family.     Results       Procedure Component Value Units Date/Time    UA [[id number]]  (  Normal) Collected: 12/11/21 1821    Specimen: Clean Catch Updated: 12/11/21 1821     Color, UA Dark Yellow     Clarity, UA Clear     Leukocytes UA Negative     Nitrite UA Negative     Urobilinogen UA Negative (0.2 mg/dl)     Protein UA Negative     pH UA 6.0     Blood UA Negative     Specific Gravity UA 1.015     Ketone UA Negative     Bilirubin UA Negative     Glucose UA Negative            There were no x-rays reviewed with this patient during the visit.    No current facility-administered medications for this visit.       PROCEDURES     Procedures    MEDICAL DECISION MAKING     History, physical, labs/studies most consistent with testicular pain  as the diagnosis.      Differential Diagnosis: UTI, std, Epididymitis       ASSESSMENT     Encounter Diagnosis   Name Primary?    Testicular pain, left Yes            PLAN      PLAN:   21 year old male with complain of testicular pain - unable to reproductive the pain, no swelling noted.  Will treat for possible epididymitis and wait for culture     Urology follow up   Follow up with PCP or to ED for worsening symptoms     Orders Placed This Encounter   Procedures    Urine-Chlamydia/Neisseria/M. genitalium/Trich PCR    UA     Requested Prescriptions     Signed Prescriptions Disp Refills    doxycycline (VIBRA-TABS) 100 MG tablet  20 tablet 0     Sig: Take 1 tablet (100 mg) by mouth 2 (two) times daily for 10 days       Discussed results and diagnosis with patient/family.  Reviewed warning signs for worsening condition, as well as, indications for follow-up with primary care physician and return to urgent care clinic.   Patient/family expressed understanding of instructions.     An After Visit Summary was provided to the patient.

## 2021-12-11 NOTE — Patient Instructions (Signed)
Thank you for choosing [facility]  Drink plenty of fluid   We will call you with culture result   Follow up with urologist and PCP as soon as possible   Please follow up with your primary care doctor in 3-4 days. It is important to receive annual screenings and routine preventive care.   To the Emergency Department for worsening symptoms.    Please remember to wash your hands it is the best way to reduce illness and the spread of germs.

## 2021-12-12 ENCOUNTER — Encounter (INDEPENDENT_AMBULATORY_CARE_PROVIDER_SITE_OTHER): Payer: Self-pay

## 2021-12-12 ENCOUNTER — Encounter (INDEPENDENT_AMBULATORY_CARE_PROVIDER_SITE_OTHER): Payer: No Typology Code available for payment source

## 2021-12-12 LAB — URINE-CHLAMYDIA/NEISSERIA/M. GENITALIUM/TRICH PCR
Chlamydia DNA by PCR: NEGATIVE
Mycoplasma Genitalium PCR: NEGATIVE
Neisseria gonorrhoeae by PCR: NEGATIVE
Trichomonas vaginalis, DNA: NEGATIVE

## 2021-12-12 NOTE — Progress Notes (Signed)
Please notify patient that their STI results are negative. Follow up with PCP if having symptoms.

## 2022-01-14 ENCOUNTER — Ambulatory Visit (INDEPENDENT_AMBULATORY_CARE_PROVIDER_SITE_OTHER): Payer: No Typology Code available for payment source | Admitting: Radiology

## 2022-01-14 ENCOUNTER — Encounter (INDEPENDENT_AMBULATORY_CARE_PROVIDER_SITE_OTHER): Payer: Self-pay | Admitting: Internal Medicine

## 2022-01-14 DIAGNOSIS — Z8279 Family history of other congenital malformations, deformations and chromosomal abnormalities: Secondary | ICD-10-CM

## 2022-02-04 ENCOUNTER — Encounter (INDEPENDENT_AMBULATORY_CARE_PROVIDER_SITE_OTHER): Payer: No Typology Code available for payment source

## 2022-03-08 ENCOUNTER — Telehealth (INDEPENDENT_AMBULATORY_CARE_PROVIDER_SITE_OTHER): Payer: No Typology Code available for payment source | Admitting: Family Medicine

## 2022-03-08 ENCOUNTER — Encounter (INDEPENDENT_AMBULATORY_CARE_PROVIDER_SITE_OTHER): Payer: Self-pay | Admitting: Family Medicine

## 2022-03-08 ENCOUNTER — Encounter (INDEPENDENT_AMBULATORY_CARE_PROVIDER_SITE_OTHER): Payer: Self-pay

## 2022-03-08 DIAGNOSIS — J0100 Acute maxillary sinusitis, unspecified: Secondary | ICD-10-CM

## 2022-03-08 MED ORDER — AMOXICILLIN-POT CLAVULANATE 875-125 MG PO TABS
1.0000 | ORAL_TABLET | Freq: Two times a day (BID) | ORAL | 0 refills | Status: AC
Start: 2022-03-08 — End: 2022-03-18

## 2022-03-08 MED ORDER — MUPIROCIN 2 % EX OINT
TOPICAL_OINTMENT | Freq: Two times a day (BID) | CUTANEOUS | 0 refills | Status: DC
Start: 2022-03-08 — End: 2023-05-16

## 2022-03-08 NOTE — Progress Notes (Signed)
[facility]  [facility]ARE  VIDEO VISIT NOTE     Patient: Kevin Flowers   Date: 03/08/2022   MRN: [id number]     This visit is being conducted via video and telephone.   Verbal consent has been obtained from the patient to conduct a video and telephone visit to minimize exposure to COVID-19: yes    Patient confirms that currently in [place] at the time of this visit: yes    Kevin Flowers is a 21 y.o. male     Subjective     HPI Kevin Flowers complains of congestion, swollen glands, nasal blockage, post nasal drip, dry cough, and headache for 21 days. He denies a history of chest pain and fatigue and denies a history of asthma. Patient does not smoke cigarettes.    History:    Pertinent Past Medical, Surgical, Family and Social History were reviewed.      No current outpatient medications on file.    No Known Allergies    Medications and Allergies reviewed.    Objective     Physical Exam  Constitutional: Alert and Normal appearance. Not in acute distress.Not ill-appearing or toxic-appearing.   HENT: Normocephalic and atraumatic.   Eyes: Conjunctivae not injected.  Respiratory: Normal effort. Able to speak in full sentences.  Neurological: Mental Status: Alert and oriented.  Psychiatric: Mood and Affect normal. Behavior normal.     LABS/IMAGING:    There were no labs reviewed with this patient during the visit.    There were no x-rays reviewed with this patient during the visit.         MDM: -Patient to follow up with primary care doctor or follow up here if symptoms worsening or not resolving as expected.   -Follow up with ED with acute worsening of symptoms.  -Patient verbalized understanding.    -Reviewed discharge instructions that are included here with patient, and printed in AVS.   -Questions answered.  -Risks and benefits of therapy discussed.  Pt agrees and understands.      Differential Diagnosis: Sinusitis, Bronchitis, Pharyngitis, Pneumonia, Influenza, COVID-19 and Allergic Rhinitis    There are no diagnoses  linked to this encounter.      Discussed diagnosis and treatment with patient.  Reviewed warning signs for worsening condition as well as indications for follow-up with pmd, return to the urgent care center or emergency department.  Patient expressed understanding of instructions.      An After Visit Summary was transmitted to the patient via MyChart.

## 2022-05-31 ENCOUNTER — Encounter (INDEPENDENT_AMBULATORY_CARE_PROVIDER_SITE_OTHER): Payer: Self-pay

## 2022-09-24 ENCOUNTER — Encounter (INDEPENDENT_AMBULATORY_CARE_PROVIDER_SITE_OTHER): Payer: Self-pay

## 2023-02-01 ENCOUNTER — Encounter (INDEPENDENT_AMBULATORY_CARE_PROVIDER_SITE_OTHER): Payer: Self-pay

## 2023-05-16 ENCOUNTER — Ambulatory Visit (INDEPENDENT_AMBULATORY_CARE_PROVIDER_SITE_OTHER): Payer: No Typology Code available for payment source | Admitting: Cardiovascular Disease

## 2023-05-16 ENCOUNTER — Encounter (INDEPENDENT_AMBULATORY_CARE_PROVIDER_SITE_OTHER): Payer: Self-pay | Admitting: Cardiovascular Disease

## 2023-05-16 VITALS — BP 122/84 | HR 64 | Wt 220.0 lb

## 2023-05-16 DIAGNOSIS — R002 Palpitations: Secondary | ICD-10-CM

## 2023-05-16 DIAGNOSIS — Z8279 Family history of other congenital malformations, deformations and chromosomal abnormalities: Secondary | ICD-10-CM

## 2023-05-16 LAB — ECG 12-LEAD
Atrial Rate: 64 {beats}/min
IHS MUSE NARRATIVE AND IMPRESSION: NORMAL
P Axis: 63 degrees
P-R Interval: 130 ms
Q-T Interval: 408 ms
QRS Duration: 82 ms
QTC Calculation (Bezet): 420 ms
R Axis: 73 degrees
T Axis: 46 degrees
Ventricular Rate: 64 {beats}/min

## 2023-05-16 NOTE — Progress Notes (Signed)
 [facility] [facility] OFFICE CONSULTATION NOTE    HRT [facility] OFFICE  [facility] HEART [facility] OFFICE -CARDIOLOGY  [address]  [place] [address]  Dept: [phone]  Dept Fax: [phone]         Patient Name: Kevin Flowers

## 2023-06-02 ENCOUNTER — Encounter (INDEPENDENT_AMBULATORY_CARE_PROVIDER_SITE_OTHER): Payer: No Typology Code available for payment source

## 2024-04-16 ENCOUNTER — Encounter (INDEPENDENT_AMBULATORY_CARE_PROVIDER_SITE_OTHER): Payer: Self-pay
